# Patient Record
Sex: Male | Born: 1995 | Race: White | Hispanic: No | Marital: Single | Smoking: Never smoker
Health system: Southern US, Community
[De-identification: ages and names within clinical notes are randomized; demographics above are authoritative.]

## PROBLEM LIST (undated history)

## (undated) DIAGNOSIS — IMO0002 Reserved for concepts with insufficient information to code with codable children: Secondary | ICD-10-CM

## (undated) DIAGNOSIS — R51 Headache: Secondary | ICD-10-CM

## (undated) DIAGNOSIS — S060XAA Concussion with loss of consciousness status unknown, initial encounter: Secondary | ICD-10-CM

## (undated) DIAGNOSIS — S060X9A Concussion with loss of consciousness of unspecified duration, initial encounter: Secondary | ICD-10-CM

## (undated) HISTORY — DX: Headache: R51

## (undated) HISTORY — PX: CIRCUMCISION: SUR203

---
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
## 2016-02-13 ENCOUNTER — Telehealth: Payer: Self-pay

## 2016-02-13 NOTE — Telephone Encounter (Signed)
Spoke with Tinnie GensJeffrey (dad), let him know we need to schedule an appointment, and it is time for MRI. He will CB to schedule

## 2016-02-13 NOTE — Telephone Encounter (Signed)
-----   Message from Elveria Risingina Goodpasture, NP sent at 02/09/2016  1:39 PM EDT ----- Regarding: Needs appointment Roger Tran needs to come in for an appointment with me. If Mom asks, it is time to schedule the repeat MRI of the brain, but he needs to come in for a visit before that can be scheduled. Please let me know if you have any questions.  Thanks, Roger Tran

## 2019-07-13 ENCOUNTER — Ambulatory Visit: Payer: No Typology Code available for payment source | Attending: Internal Medicine

## 2019-07-13 DIAGNOSIS — Z20822 Contact with and (suspected) exposure to covid-19: Secondary | ICD-10-CM | POA: Insufficient documentation

## 2019-07-14 LAB — NOVEL CORONAVIRUS, NAA: SARS-CoV-2, NAA: NOT DETECTED

## 2020-03-09 ENCOUNTER — Other Ambulatory Visit: Payer: No Typology Code available for payment source

## 2020-03-09 DIAGNOSIS — Z20822 Contact with and (suspected) exposure to covid-19: Secondary | ICD-10-CM

## 2020-03-11 LAB — SARS-COV-2, NAA 2 DAY TAT

## 2020-03-11 LAB — NOVEL CORONAVIRUS, NAA: SARS-CoV-2, NAA: NOT DETECTED
